# Patient Record
Sex: Male | Born: 1980 | Race: White | Hispanic: No | Marital: Single | Smoking: Current every day smoker
Health system: Southern US, Community
[De-identification: ages and names within clinical notes are randomized; demographics above are authoritative.]

## PROBLEM LIST (undated history)

## (undated) DIAGNOSIS — I509 Heart failure, unspecified: Secondary | ICD-10-CM

## (undated) HISTORY — PX: OTHER SURGICAL HISTORY: SHX169

---
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
## 2003-09-19 ENCOUNTER — Emergency Department (HOSPITAL_COMMUNITY): Admission: EM | Admit: 2003-09-19 | Discharge: 2003-09-19 | Payer: Self-pay | Admitting: Emergency Medicine

## 2004-06-20 ENCOUNTER — Ambulatory Visit (HOSPITAL_COMMUNITY): Admission: RE | Admit: 2004-06-20 | Discharge: 2004-06-20 | Payer: Self-pay | Admitting: Family Medicine

## 2004-06-25 ENCOUNTER — Ambulatory Visit: Payer: Self-pay | Admitting: Orthopedic Surgery

## 2004-07-30 ENCOUNTER — Ambulatory Visit: Payer: Self-pay | Admitting: Orthopedic Surgery

## 2004-12-06 ENCOUNTER — Ambulatory Visit (HOSPITAL_COMMUNITY): Admission: RE | Admit: 2004-12-06 | Discharge: 2004-12-06 | Payer: Self-pay | Admitting: Family Medicine

## 2005-02-25 ENCOUNTER — Emergency Department (HOSPITAL_COMMUNITY): Admission: EM | Admit: 2005-02-25 | Discharge: 2005-02-25 | Payer: Self-pay | Admitting: Emergency Medicine

## 2006-02-12 ENCOUNTER — Emergency Department (HOSPITAL_COMMUNITY): Admission: EM | Admit: 2006-02-12 | Discharge: 2006-02-12 | Payer: Self-pay | Admitting: Emergency Medicine

## 2006-07-21 ENCOUNTER — Emergency Department (HOSPITAL_COMMUNITY): Admission: EM | Admit: 2006-07-21 | Discharge: 2006-07-22 | Payer: Self-pay | Admitting: Emergency Medicine

## 2007-09-14 ENCOUNTER — Emergency Department (HOSPITAL_COMMUNITY): Admission: EM | Admit: 2007-09-14 | Discharge: 2007-09-14 | Payer: Self-pay | Admitting: Emergency Medicine

## 2009-11-22 ENCOUNTER — Emergency Department (HOSPITAL_COMMUNITY): Admission: EM | Admit: 2009-11-22 | Discharge: 2009-11-22 | Payer: Self-pay | Admitting: Emergency Medicine

## 2009-11-23 ENCOUNTER — Ambulatory Visit (HOSPITAL_COMMUNITY): Admission: RE | Admit: 2009-11-23 | Discharge: 2009-11-23 | Payer: Self-pay | Admitting: Emergency Medicine

## 2009-12-18 ENCOUNTER — Ambulatory Visit (HOSPITAL_COMMUNITY)
Admission: RE | Admit: 2009-12-18 | Discharge: 2009-12-18 | Payer: Self-pay | Source: Home / Self Care | Attending: Urology | Admitting: Urology

## 2010-03-19 LAB — BASIC METABOLIC PANEL
BUN: 10 mg/dL (ref 6–23)
CO2: 27 mEq/L (ref 19–32)
Calcium: 9.5 mg/dL (ref 8.4–10.5)
Chloride: 102 mEq/L (ref 96–112)
Creatinine, Ser: 1.05 mg/dL (ref 0.4–1.5)
GFR calc Af Amer: >60 mL/min (ref >60)
GFR calc non Af Amer: >60 mL/min (ref >60)
Glucose, Bld: 120 mg/dL — ABNORMAL HIGH (ref 70–99)
Potassium: 3.9 mEq/L (ref 3.5–5.1)
Sodium: 138 mEq/L (ref 135–145)

## 2010-03-19 LAB — CBC
HCT: 45.2 % (ref 39.0–52.0)
Hemoglobin: 16.8 g/dL (ref 13.0–17.0)
MCH: 37.8 pg — ABNORMAL HIGH (ref 26.0–34.0)
MCHC: 37.2 g/dL — ABNORMAL HIGH (ref 30.0–36.0)
MCV: 101.6 fL — ABNORMAL HIGH (ref 78.0–100.0)
Platelets: 106 10*3/uL — ABNORMAL LOW (ref 150–400)
RBC: 4.45 MIL/uL (ref 4.22–5.81)
RDW: 12.9 % (ref 11.5–15.5)
WBC: 5.5 10*3/uL (ref 4.0–10.5)

## 2010-03-19 LAB — SURGICAL PCR SCREEN
MRSA, PCR: NEGATIVE
Staphylococcus aureus: POSITIVE — AB

## 2010-12-18 ENCOUNTER — Emergency Department (HOSPITAL_COMMUNITY): Payer: Self-pay

## 2010-12-18 ENCOUNTER — Other Ambulatory Visit: Payer: Self-pay

## 2010-12-18 ENCOUNTER — Emergency Department (HOSPITAL_COMMUNITY)
Admission: EM | Admit: 2010-12-18 | Discharge: 2010-12-18 | Disposition: A | Discharge status: Other Acute Inpatient Hospital | Payer: Self-pay | Attending: Emergency Medicine | Admitting: Emergency Medicine

## 2010-12-18 DIAGNOSIS — I4510 Unspecified right bundle-branch block: Secondary | ICD-10-CM | POA: Insufficient documentation

## 2010-12-18 DIAGNOSIS — I509 Heart failure, unspecified: Secondary | ICD-10-CM | POA: Insufficient documentation

## 2010-12-18 DIAGNOSIS — I4892 Unspecified atrial flutter: Secondary | ICD-10-CM | POA: Insufficient documentation

## 2010-12-18 DIAGNOSIS — R609 Edema, unspecified: Secondary | ICD-10-CM | POA: Insufficient documentation

## 2010-12-18 DIAGNOSIS — M7989 Other specified soft tissue disorders: Secondary | ICD-10-CM | POA: Insufficient documentation

## 2010-12-18 DIAGNOSIS — Q249 Congenital malformation of heart, unspecified: Secondary | ICD-10-CM | POA: Insufficient documentation

## 2010-12-18 DIAGNOSIS — IMO0002 Reserved for concepts with insufficient information to code with codable children: Secondary | ICD-10-CM | POA: Insufficient documentation

## 2010-12-18 DIAGNOSIS — F17200 Nicotine dependence, unspecified, uncomplicated: Secondary | ICD-10-CM | POA: Insufficient documentation

## 2010-12-18 DIAGNOSIS — R0602 Shortness of breath: Secondary | ICD-10-CM | POA: Insufficient documentation

## 2010-12-18 LAB — URINALYSIS, ROUTINE W REFLEX MICROSCOPIC
Bilirubin Urine: NEGATIVE
Glucose, UA: NEGATIVE mg/dL
Hgb urine dipstick: NEGATIVE
Ketones, ur: NEGATIVE mg/dL
Leukocytes, UA: NEGATIVE
Nitrite: NEGATIVE
Protein, ur: NEGATIVE mg/dL
Specific Gravity, Urine: 1.025 (ref 1.005–1.030)
Urobilinogen, UA: 0.2 mg/dL (ref 0.0–1.0)
pH: 6 (ref 5.0–8.0)

## 2010-12-18 LAB — CBC
HCT: 51.5 % (ref 39.0–52.0)
Hemoglobin: 18.2 g/dL — ABNORMAL HIGH (ref 13.0–17.0)
MCH: 38.2 pg — ABNORMAL HIGH (ref 26.0–34.0)
MCHC: 35.3 g/dL (ref 30.0–36.0)
MCV: 108 fL — ABNORMAL HIGH (ref 78.0–100.0)
Platelets: 120 10*3/uL — ABNORMAL LOW (ref 150–400)
RBC: 4.77 MIL/uL (ref 4.22–5.81)
RDW: 14.7 % (ref 11.5–15.5)
WBC: 5.9 10*3/uL (ref 4.0–10.5)

## 2010-12-18 LAB — COMPREHENSIVE METABOLIC PANEL
AST: 26 U/L (ref 0–37)
Albumin: 2.5 g/dL — ABNORMAL LOW (ref 3.5–5.2)
Alkaline Phosphatase: 43 U/L (ref 39–117)
BUN: 10 mg/dL (ref 6–23)
CO2: 26 mEq/L (ref 19–32)
Calcium: 8.7 mg/dL (ref 8.4–10.5)
GFR calc Af Amer: >90 mL/min (ref >90)
Glucose, Bld: 112 mg/dL — ABNORMAL HIGH (ref 70–99)
Potassium: 4.1 mEq/L (ref 3.5–5.1)
Sodium: 134 mEq/L — ABNORMAL LOW (ref 135–145)
Total Bilirubin: 0.6 mg/dL (ref 0.3–1.2)
Total Protein: 4.8 g/dL — ABNORMAL LOW (ref 6.0–8.3)

## 2010-12-18 LAB — RAPID URINE DRUG SCREEN, HOSP PERFORMED
Barbiturates: NOT DETECTED
Cocaine: NOT DETECTED
Opiates: NOT DETECTED
Tetrahydrocannabinol: POSITIVE — AB

## 2010-12-18 LAB — APTT: aPTT: 26 seconds (ref 24–37)

## 2010-12-18 LAB — DIFFERENTIAL
Basophils Absolute: 0 10*3/uL (ref 0.0–0.1)
Basophils Relative: 1 % (ref 0–1)
Eosinophils Absolute: 0 10*3/uL (ref 0.0–0.7)
Eosinophils Relative: 1 % (ref 0–5)
Lymphs Abs: 0.6 10*3/uL — ABNORMAL LOW (ref 0.7–4.0)
Monocytes Absolute: 0.6 10*3/uL (ref 0.1–1.0)
Monocytes Relative: 10 % (ref 3–12)
Neutro Abs: 4.6 10*3/uL (ref 1.7–7.7)

## 2010-12-18 LAB — PROTIME-INR
INR: 1.07 (ref 0.00–1.49)
Prothrombin Time: 14.1 seconds (ref 11.6–15.2)

## 2010-12-18 LAB — TROPONIN I: Troponin I: <0.3 ng/mL (ref <0.30)

## 2010-12-18 MED ORDER — HEPARIN SOD (PORCINE) IN D5W 100 UNIT/ML IV SOLN
INTRAVENOUS | Status: AC
  Filled 2010-12-18: qty 250

## 2010-12-18 MED ORDER — FUROSEMIDE 10 MG/ML IJ SOLN
60.0000 mg | Freq: Once | INTRAMUSCULAR | Status: AC
  Administered 2010-12-18: 60 mg via INTRAVENOUS
  Filled 2010-12-18: qty 6

## 2010-12-18 MED ORDER — HEPARIN (PORCINE) IN NACL 100-0.45 UNIT/ML-% IJ SOLN
1000.0000 [IU]/h | INTRAMUSCULAR | Status: DC
  Administered 2010-12-18: 1000 [IU]/h via INTRAVENOUS
  Filled 2010-12-18 (×2): qty 250

## 2010-12-18 MED ORDER — DILTIAZEM HCL 50 MG/10ML IV SOLN
15.0000 mg | Freq: Once | INTRAVENOUS | Status: AC
  Administered 2010-12-18: 15 mg via INTRAVENOUS
  Filled 2010-12-18: qty 10

## 2010-12-18 MED ORDER — IOHEXOL 350 MG/ML SOLN
80.0000 mL | Freq: Once | INTRAVENOUS | Status: AC | PRN
  Administered 2010-12-18: 80 mL via INTRAVENOUS

## 2010-12-18 MED ORDER — HEPARIN BOLUS VIA INFUSION
4000.0000 [IU] | Freq: Once | INTRAVENOUS | Status: AC
  Administered 2010-12-18: 4000 [IU] via INTRAVENOUS
  Filled 2010-12-18: qty 4000

## 2010-12-18 MED ORDER — DILTIAZEM HCL 100 MG IV SOLR
5.0000 mg/h | INTRAVENOUS | Status: DC
  Administered 2010-12-18: 5 mg/h via INTRAVENOUS
  Administered 2010-12-18: 15 mg/h via INTRAVENOUS
  Filled 2010-12-18: qty 100

## 2010-12-18 NOTE — ED Notes (Signed)
Pt presents with bilateral leg swelling x 3 days.

## 2010-12-18 NOTE — ED Provider Notes (Addendum)
History     CSN: [id number] Arrival date & time: 12/18/2010  1:13 PM   First MD Initiated Contact with Patient 12/18/10 1323      Chief Complaint  Patient presents with  . Leg Swelling    (Consider location/radiation/quality/duration/timing/severity/associated sxs/prior treatment) HPI Patient relates he had some type of congenital heart disease and when he was 30 years old he had a Fontan procedure done at [facility]. He states he does not take SBE prophylaxis. Patient states about a week ago he did not feel well he states he felt extremely sick he said his head felt "horrible". He denies having a headache, he states is mainly fatigue. He states he then felt better and work for 3 days however 3 days ago he started getting swelling of his lower legs. He denies pain in his legs. He also states he feels that the swelling is up in his abdomen and his back. He also started having shortness of breath without chest pain. He's had a dry cough without fever. He denies nausea vomiting or diarrhea. He states he has dyspnea on exertion. He denies any change in activity or injury.  Primary care physician none   History reviewed. No pertinent past medical history. Congenital heart disease Degenerative disc disease of the back  Past Surgical History  Procedure Date  . Fontain procedure    hydrocele of the right testicle repaired recently  No family history on file. Both his maternal and paternal grandfather had CABG, cancer prominent in both sides of the family  History  Substance Use Topics  . Smoking status: Current Everyday Smoker -- 1.2 packs/day    Types: Cigarettes  . Smokeless tobacco: Not on file  . Alcohol Use: Yes     occ   employed    Review of Systems  All other systems reviewed and are negative.    Allergies  Review of patient's allergies indicates no known allergies.  Home Medications   Current Outpatient Rx  Name Route Sig Dispense Refill  .  DIPHENHYDRAMINE HCL 25 MG PO TABS Oral Take 25 mg by mouth every 6 (six) hours as needed. Swelling     . FISH OIL 1000 MG PO CAPS Oral Take 1 capsule by mouth 3 (three) times daily.      . OXYCODONE-ACETAMINOPHEN 5-325 MG PO TABS Oral Take 1 tablet by mouth every 4 (four) hours as needed. Pain       BP 127/96  Pulse 47  Temp(Src) 98.4 F (36.9 C) (Oral)  Resp 9  Ht 5\' 10"  (1.778 m)  Wt 150 lb (68.04 kg)  BMI 21.52 kg/m2  SpO2 93%  Vital signs show bradycardia (this must be a radial pulse because his rate on monitor is 135), otherwise normal  Physical Exam  Nursing note and vitals reviewed. Constitutional: He is oriented to person, place, and time. He appears well-developed and well-nourished.  Non-toxic appearance. He does not appear ill. No distress.  HENT:  Head: Normocephalic and atraumatic.  Right Ear: External ear normal.  Left Ear: External ear normal.  Nose: Nose normal. No mucosal edema or rhinorrhea.  Mouth/Throat: Oropharynx is clear and moist and mucous membranes are normal. No dental abscesses or uvula swelling.  Eyes: Conjunctivae and EOM are normal. Pupils are equal, round, and reactive to light.  Neck: Normal range of motion and full passive range of motion without pain. Neck supple.  Cardiovascular: Normal rate, regular rhythm and normal heart sounds.  Exam reveals no gallop and no  friction rub.   No murmur heard. Pulmonary/Chest: No respiratory distress. He has wheezes. He has no rhonchi. He has no rales. He exhibits no tenderness and no crepitus.       Patient appears anxious, he is noted to have some late and expiratory scattered wheezing.  Abdominal: Soft. Normal appearance and bowel sounds are normal. He exhibits no distension. There is no tenderness. There is no rebound and no guarding.  Musculoskeletal: Normal range of motion. He exhibits edema. He exhibits no tenderness.       Moves all extremities well. Patient's noted to have pitting edema around his ankles  and feet with trace pitting edema of his lower legs. He said to have scattered small punctate red lesions on his skin he states are not prutici  Neurological: He is alert and oriented to person, place, and time. He has normal strength. No cranial nerve deficit.  Skin: Skin is warm, dry and intact. No rash noted. No erythema. No pallor.  Psychiatric: His speech is normal and behavior is normal. His mood appears not anxious.       Patient anxious, he told the nurse he passes out when he gets his blood drawn    ED Course  Procedures (including critical care time)  Patient was started on Cardizem bolus and drip. His heart rate stabilized to around 100. He was noted to have atrial flutter with variable block. He was given Lasix IV for his congestive heart failure and was having good urine output at the time of transfer. She is started on a heparin bolus and heparin drip prior to transfer.   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 It is useful he done with patient to have hypoplastic right or left heart syndrome or disease (hypoplastic left or right ventricle), or tricuspid atresia with hypoplastic right ventricle. It states the blood flows from the IVC and bypasses the right ventricle and go strictly to the pulmonary artery passively. It also states patient's need SBE prophylaxis which patient states he does not do. It also states a common complication his atrial flutter due to scar tissue in the right atrium.  1510 I spoke with Dr. Tyron Russell, radiology trying to decide how to do a spiral CT angiography chest. He is going to do more research to try to figure out timing of the dye  1519 Dr. Juanetta Gosling office states it's been over 15 years since he was there. She states only diagnosis she found with cardiac arrhythmia and Dr. Juanetta Gosling had done EKG on him.  1524 Dr. Tyron Russell states her 3 variations of the Fontan procedure, he recommends doing noncontrast CT to CT computerized the anatomy before we  do contrast. He states one variation the blood supply to the right lung and left lung are from different sources so injecting him for a spiral CT would only study 1 lung.  17:54 Dr Lyndel Safe, [facility] accepts in transfer, waiting to get bed assignment.  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, he also had some heart valves that were not performed and he had surgery to develop his valves and then he had the Fortan  procedure done   Results for orders placed during the hospital encounter of 12/18/10  CBC      Component Value Range   WBC 5.9  4.0 - 10.5 (K/uL)   RBC 4.77  4.22 - 5.81 (MIL/uL)   Hemoglobin 18.2 (*) 13.0 -  17.0 (g/dL)   HCT 16.1  09.6 - 04.5 (%)   MCV 108.0 (*) 78.0 - 100.0 (fL)   MCH 38.2 (*) 26.0 - 34.0 (pg)   MCHC 35.3  30.0 - 36.0 (g/dL)   RDW 40.9  81.1 - 91.4 (%)   Platelets 120 (*) 150 - 400 (K/uL)  DIFFERENTIAL      Component Value Range   Neutrophils Relative 78 (*) 43 - 77 (%)   Neutro Abs 4.6  1.7 - 7.7 (K/uL)   Lymphocytes Relative 11 (*) 12 - 46 (%)   Lymphs Abs 0.6 (*) 0.7 - 4.0 (K/uL)   Monocytes Relative 10  3 - 12 (%)   Monocytes Absolute 0.6  0.1 - 1.0 (K/uL)   Eosinophils Relative 1  0 - 5 (%)   Eosinophils Absolute 0.0  0.0 - 0.7 (K/uL)   Basophils Relative 1  0 - 1 (%)   Basophils Absolute 0.0  0.0 - 0.1 (K/uL)  COMPREHENSIVE METABOLIC PANEL      Component Value Range   Sodium 134 (*) 135 - 145 (mEq/L)   Potassium 4.1  3.5 - 5.1 (mEq/L)   Chloride 100  96 - 112 (mEq/L)   CO2 26  19 - 32 (mEq/L)   Glucose, Bld 112 (*) 70 - 99 (mg/dL)   BUN 10  6 - 23 (mg/dL)   Creatinine, Ser 7.82  0.50 - 1.35 (mg/dL)   Calcium 8.7  8.4 - 95.6 (mg/dL)   Total Protein 4.8 (*) 6.0 - 8.3 (g/dL)   Albumin 2.5 (*) 3.5 - 5.2 (g/dL)   AST 26  0 - 37 (U/L)   ALT 16  0 - 53 (U/L)   Alkaline Phosphatase 43  39 - 117 (U/L)   Total Bilirubin 0.6  0.3 - 1.2 (mg/dL)   GFR calc non Af Amer  >90  >90 (mL/min)   GFR calc Af Amer >90  >90 (mL/min)  TROPONIN I      Component Value Range   Troponin I <0.30  <0.30 (ng/mL)  APTT      Component Value Range   aPTT 26  24 - 37 (seconds)  PROTIME-INR      Component Value Range   Prothrombin Time 14.1  11.6 - 15.2 (seconds)   INR 1.07  0.00 - 1.49   D-DIMER, QUANTITATIVE      Component Value Range   D-Dimer, Quant 1.50 (*) 0.00 - 0.48 (ug/mL-FEU)  PRO B NATRIURETIC PEPTIDE      Component Value Range   Pro B Natriuretic peptide (BNP) 1228.0 (*) 0 - 125 (pg/mL)  POCT I-STAT TROPONIN I      Component Value Range   Troponin i, poc 0.00  0.00 - 0.08 (ng/mL)   Comment 3             Laboratory interpretation elevated d-dimer, elevated BNP, very elevated MCV, concentrated hemoglobin, otherwise normal  Ct Angio Chest W/cm &/or Wo Cm  12/18/2010  *RADIOLOGY REPORT*  Clinical Data:  Leg swelling, elevated D-dimer, question pulmonary embolism, history congenital heart disease post Fontan procedure 08/06  CT ANGIOGRAPHY CHEST WITH CONTRAST  Technique:  Multidetector CT imaging of the chest was performed using the standard protocol during bolus administration of intravenous contrast.  Multiplanar CT image reconstructions including MIPs were obtained to evaluate the vascular anatomy. Initial noncontrast images were obtained to delineate the thoracic vascular anatomy.  Contrast: 80mL OMNIPAQUE IOHEXOL 350 MG/ML IV SOLN  Comparison:  None  Findings: Aorta normal caliber.  Absent right ventricle and tricuspid valve. Post Fontan procedure with patent anastomosis of right atrium to main pulmonary artery. Upper lobe pulmonary arteries are suboptimally opacified, grossly patent. Remaining pulmonary arteries appear patent. No definite evidence of pulmonary embolism identified. Numerous normal and upper normal-sized thoracic lymph nodes. A few tiny nonspecific right lung nodules are identified, with minimal atelectasis at medial aspect of right middle lobe. No  pulmonary infiltrate or pneumothorax. Prior median sternotomy but no acute osseous findings.  Abnormal appearance of visualized portion of upper abdomen. Liver appears nodular and cirrhotic. Spleen appears mildly enlarged though incompletely visualized, measures 11.9 x 9.0 cm axial dimensions image 56. Small amount of perihepatic ascites. Tiny left pleural effusion and minimal pericardial effusion. Scattered subcutaneous soft tissue edema. Mild gynecomastia bilaterally.  Review of the MIP images confirms the above findings.  IMPRESSION: No definite evidence of pulmonary embolism, though upper lobe pulmonary arteries are somewhat less well opacified and assessed. Post Fontan procedure with patent right atrial to main pulmonary artery anastomosis. Cirrhotic appearing liver with minimal ascites, scattered subcutaneous edema, and question minimal splenic enlargement, the spleen and liver are both incompletely visualized on CT chest.  Original Report Authenticated By: Lollie Marrow, M.D.   US Venous Img Lower Bilateral  12/18/2010  *RADIOLOGY REPORT*  Clinical Data: Bilateral lower extremity swelling, elevated D- dimer, question deep venous thrombosis  VENOUS DUPLEX ULTRASOUND OF BILATERAL LOWER EXTREMITIES  Technique:  Gray-scale sonography with graded compression, as well as color Doppler and duplex ultrasound, were performed to evaluate the deep venous system of both lower extremities from the level of the common femoral vein through the popliteal and proximal calf veins.  Spectral Doppler was utilized to evaluate flow at rest and with distal augmentation maneuvers.  Comparison:  None  Findings: Deep venous systems appear patent and compressible from groin to popliteal fossa bilaterally.  Spontaneous venous flow present with intact augmentation and evidence of respiratory phasicity.  No intraluminal thrombus identified.  Scattered subcutaneous edema identified at the calves.  Visualized portions of the greater  saphenous systems appear patent.  IMPRESSION: No evidence of deep venous thrombosis in the lower extremities.  Original Report Authenticated By: Lollie Marrow, M.D.   Dg Chest Portable 1 View  12/18/2010  *RADIOLOGY REPORT*  Clinical Data: Shortness of breath.  Swelling in the legs and face 3 days.  History of smoking.  Increased heart rate.  PORTABLE CHEST - 1 VIEW  Comparison: None.  Findings: Heart is enlarged.  There is perihilar bronchitic change. Right paratracheal density raises the question of adenopathy. Further evaluation with chest CT with contrast is recommended. There are no focal consolidations or pleural effusions.  IMPRESSION:  1.  Cardiomegaly. 2.  Bronchitic changes. 3.  Question of mediastinal adenopathy.  Further evaluation with chest CT with contrast is suggested.  Original Report Authenticated By: Patterson Hammersmith, M.D.       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  Critical care was necessary to treat or prevent imminent or life-threatening deterioration.  Critical care was time spent personally by me on the following activities: development of treatment plan with patient and/or surrogate as well as nursing, discussions with consultants, evaluation of patient's response to treatment, examination of patient, obtaining history from  patient or surrogate, ordering and performing treatments and interventions, ordering and review of laboratory studies, ordering and review of radiographic studies, pulse oximetry and re-evaluation of patient's condition.      MDM          Ward Givens, MD 12/18/10 2010  Ward Givens, MD 12/18/10 2011

## 2010-12-18 NOTE — ED Notes (Signed)
Pt with heart rate 130s and reports SOB; bilateral 2+pitting edema bilateral lower extremities to above ankles. Denies pain; A&Ox4; answers questions appropriately.

## 2010-12-18 NOTE — ED Notes (Signed)
Moved to ED 19-placed on cardiac monitor and continuous pulse ox-heart rate 136; RA SpO2 88%; placed on O2 2L/min per [place]--SpO2 increased to 93%; EKG obtained.

## 2010-12-18 NOTE — ED Notes (Signed)
Pt resting.  HR remains slightly tachycardic.  Denies any pain at this time. No distress or SOB noted.  Lasix and cardizem given. Cardizem gtt continues at 15 mg/hr.

## 2013-03-02 ENCOUNTER — Ambulatory Visit: Payer: Medicaid Other | Admitting: Podiatry

## 2013-03-30 ENCOUNTER — Ambulatory Visit: Payer: Medicaid Other | Admitting: Podiatry

## 2014-06-24 ENCOUNTER — Emergency Department (HOSPITAL_COMMUNITY): Payer: Medicare Other

## 2014-06-24 ENCOUNTER — Emergency Department (HOSPITAL_COMMUNITY)
Admission: EM | Admit: 2014-06-24 | Discharge: 2014-06-25 | Disposition: A | Discharge status: Other Acute Inpatient Hospital | Payer: Medicare Other | Attending: Emergency Medicine | Admitting: Emergency Medicine

## 2014-06-24 ENCOUNTER — Encounter (HOSPITAL_COMMUNITY): Payer: Self-pay | Admitting: Emergency Medicine

## 2014-06-24 DIAGNOSIS — F1110 Opioid abuse, uncomplicated: Secondary | ICD-10-CM | POA: Diagnosis not present

## 2014-06-24 DIAGNOSIS — I469 Cardiac arrest, cause unspecified: Secondary | ICD-10-CM | POA: Diagnosis present

## 2014-06-24 DIAGNOSIS — R4020 Unspecified coma: Secondary | ICD-10-CM | POA: Insufficient documentation

## 2014-06-24 DIAGNOSIS — E876 Hypokalemia: Secondary | ICD-10-CM | POA: Diagnosis not present

## 2014-06-24 DIAGNOSIS — Z720 Tobacco use: Secondary | ICD-10-CM | POA: Diagnosis not present

## 2014-06-24 DIAGNOSIS — J9601 Acute respiratory failure with hypoxia: Secondary | ICD-10-CM | POA: Diagnosis not present

## 2014-06-24 DIAGNOSIS — Z79899 Other long term (current) drug therapy: Secondary | ICD-10-CM | POA: Insufficient documentation

## 2014-06-24 DIAGNOSIS — F1310 Sedative, hypnotic or anxiolytic abuse, uncomplicated: Secondary | ICD-10-CM | POA: Insufficient documentation

## 2014-06-24 DIAGNOSIS — N179 Acute kidney failure, unspecified: Secondary | ICD-10-CM

## 2014-06-24 DIAGNOSIS — E1311 Other specified diabetes mellitus with ketoacidosis with coma: Secondary | ICD-10-CM

## 2014-06-24 HISTORY — DX: Heart failure, unspecified: I50.9

## 2014-06-24 LAB — CBG MONITORING, ED: GLUCOSE-CAPILLARY: 337 mg/dL — AB (ref 65–99)

## 2014-06-24 MED ORDER — MIDAZOLAM HCL 2 MG/2ML IJ SOLN
2.0000 mg | Freq: Once | INTRAMUSCULAR | Status: AC
  Administered 2014-06-24: 2 mg via INTRAVENOUS

## 2014-06-24 MED ORDER — SODIUM CHLORIDE 0.9 % IV SOLN
2000.0000 mL | Freq: Once | INTRAVENOUS | Status: AC
  Administered 2014-06-25: 2000 mL via INTRAVENOUS

## 2014-06-24 MED ORDER — PROPOFOL 10 MG/ML IV BOLUS
INTRAVENOUS | Status: AC
  Filled 2014-06-24: qty 20

## 2014-06-24 MED ORDER — LIDOCAINE HCL (CARDIAC) 20 MG/ML IV SOLN
INTRAVENOUS | Status: AC
  Filled 2014-06-24: qty 5

## 2014-06-24 MED ORDER — ROCURONIUM BROMIDE 50 MG/5ML IV SOLN
INTRAVENOUS | Status: AC
  Filled 2014-06-24: qty 2

## 2014-06-24 MED ORDER — PROPOFOL 1000 MG/100ML IV EMUL
INTRAVENOUS | Status: AC
  Filled 2014-06-24: qty 100

## 2014-06-24 MED ORDER — ETOMIDATE 2 MG/ML IV SOLN
INTRAVENOUS | Status: AC
  Administered 2014-06-24: 20 mg
  Filled 2014-06-24: qty 20

## 2014-06-24 MED ORDER — MIDAZOLAM HCL 2 MG/2ML IJ SOLN
INTRAMUSCULAR | Status: AC
  Administered 2014-06-24: 2 mg via INTRAVENOUS
  Filled 2014-06-24: qty 2

## 2014-06-24 MED ORDER — PROPOFOL 1000 MG/100ML IV EMUL
5.0000 ug/kg/min | INTRAVENOUS | Status: DC
  Administered 2014-06-24: 5 ug/kg/min via INTRAVENOUS

## 2014-06-24 MED ORDER — FENTANYL CITRATE (PF) 100 MCG/2ML IJ SOLN
100.0000 ug | Freq: Once | INTRAMUSCULAR | Status: AC
  Administered 2014-06-24: 100 ug via INTRAVENOUS

## 2014-06-24 MED ORDER — SUCCINYLCHOLINE CHLORIDE 20 MG/ML IJ SOLN
INTRAMUSCULAR | Status: AC
  Administered 2014-06-24: 100 mg
  Filled 2014-06-24: qty 1

## 2014-06-24 MED ORDER — FENTANYL CITRATE (PF) 100 MCG/2ML IJ SOLN
INTRAMUSCULAR | Status: AC
  Administered 2014-06-24: 100 ug via INTRAVENOUS
  Filled 2014-06-24: qty 2

## 2014-06-24 NOTE — ED Notes (Addendum)
Patient found unresponsive by family. No pulse, no breathing. Per EMS, patient given epi x 2, D50, and narcan. Shocked x 2. Patient presents to ED intubated, with pulse of 91. CBG 337. States patient is on heart transplant list.

## 2014-06-24 NOTE — ED Notes (Signed)
[facility] @ [phone]. Dr Bebe Shaggy on phone at this time.

## 2014-06-24 NOTE — ED Provider Notes (Signed)
CSN: [id number]     Arrival date & time 06/24/14  2303 History   First MD Initiated Contact with Patient 06/24/14 2306     Chief Complaint  Patient presents with  . Cardiac Arrest   LEVEL 5 CAVEAT PATIENT IS UNRESPONSIVE  Patient is a 34 y.o. male presenting with altered mental status. The history is provided by the EMS personnel and a relative.  Altered Mental Status Severity:  Severe Most recent episode:  Today Timing:  Constant Chronicity:  New Patient presents from home Father reports pt has been "sick all week" with vomiting Today while he was at home he became unresponsive.  Father reports he appeared to have seizure and was "Gasping for breath".  He was placed on floor and no trauma reported EMS called and pt was in cardiac arrest [facility] airway placed.  He was given epinephrine/narcan/D50.  He was noted to be in v-fib at one time was defibrillated.  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 CHF (congestive heart failure)    Past Surgical History  Procedure Laterality Date  . Fontain procedure     History reviewed. No pertinent family history. History  Substance Use Topics  . Smoking status: Current Every Day Smoker -- 1.25 packs/day    Types: Cigarettes  . Smokeless tobacco: Not on file  . Alcohol Use: Yes     Comment: occ    Review of Systems  Unable to perform ROS: Patient unresponsive      Allergies  Review of patient's allergies indicates no known allergies.  Home Medications   Prior to Admission medications   Medication Sig Start Date End Date Taking? Authorizing Provider  diphenhydrAMINE (BENADRYL) 25 MG tablet Take 25 mg by mouth every 6 (six) hours as needed. Swelling     Historical Provider, MD  Omega-3 Fatty Acids (FISH OIL) 1000 MG CAPS Take 1 capsule by mouth 3 (three) times daily.      Historical Provider, MD   oxyCODONE-acetaminophen (PERCOCET) 5-325 MG per tablet Take 1 tablet by mouth every 4 (four) hours as needed. Pain     Historical Provider, MD   BP 104/82 mmHg  Pulse 41  Temp(Src) 96.1 F (35.6 C) (Other (Comment))  Resp 24  Wt 150 lb (68.04 kg)  SpO2 100% Physical Exam CONSTITUTIONAL: unresponsive HEAD: Normocephalic/atraumatic EYES: pupils fixed/dilated ENMT: Mucous membranes moist, king airway in place, vomit in mouth on arrival NECK: supple no meningeal signs SPINE/BACK:No bruising/crepitance/stepoffs noted to spine CV: loud murmur noted LUNGS: decreased BS noted bilaterally.  ABDOMEN: soft WU:[id number] NEURO: Pt is unresponsive.  He has some response to pain with IV placement.    EXTREMITIES: pulses normal/equal SKIN: cold to touch PSYCH:unable to assess  ED Course  Procedures  CRITICAL CARE Performed by: Joya Gaskins Total critical care time: 17 Critical care time was exclusive of separately billable procedures and treating other patients. Critical care was necessary to treat or prevent imminent or life-threatening deterioration. Critical care was time spent personally by me on the following activities: development of treatment plan with patient and/or surrogate as well as nursing, discussions with consultants, evaluation of patient's response to treatment, examination of patient, obtaining history from patient or surrogate, ordering and performing treatments and interventions, ordering and review of laboratory studies, ordering and review of radiographic studies, pulse oximetry and re-evaluation of patient's condition. MECHANICAL VENTILATION HAS BEEN MANAGED HE IS PLACED ON  IV POTASSIUM AND IV INSULIN CONSULTS HAVE BEEN CALLED PT HAS BEEN STABILIZED IN THE ED MULTIPLE DISCUSSIONS WITH FAMILY   INTUBATION Performed by: Joya Gaskins  Required items: required devices, and special equipment available Patient identity confirmed: provided demographic data and  hospital-assigned identification number Timeout not called due to emergent situation King airway removed prior to intubation Copious secretions/vomit in mouth on examination Indications: unresponsive  Intubation method: Glidescope Laryngoscopy   Preoxygenation: BVM  Sedatives: Etomidate Paralytic: Succinylcholine  Tube Size: 7.5 cuffed  Post-procedure assessment: chest rise and ETCO2 monitor Breath sounds: equal and absent over the epigastrium Tube secured with: ETT holder Chest x-ray interpreted by me.  Chest x-ray findings: endotracheal tube in appropriate position  Patient tolerated the procedure well with no immediate complications.    Labs Review Labs Reviewed  CBC - Abnormal; Notable for the following:    WBC 17.1 (*)    RBC 4.17 (*)    MCV 102.4 (*)    MCH 35.7 (*)    All other components within normal limits  CBG MONITORING, ED - Abnormal; Notable for the following:    Glucose-Capillary 337 (*)    All other components within normal limits  DIGOXIN LEVEL  BLOOD GAS, ARTERIAL  APTT  BASIC METABOLIC PANEL  PROTIME-INR  TROPONIN I  URINALYSIS, ROUTINE W REFLEX MICROSCOPIC (NOT AT [facility])  URINE RAPID DRUG SCREEN, HOSP PERFORMED  CBG MONITORING, ED  I-STAT CG4 LACTIC ACID, ED  I-STAT CHEM 8, ED    Imaging Review Ct Head Wo Contrast  13-Jul-2014   CLINICAL DATA:  Patient was found unresponsive. Apparently hit the floor from a standing position. Intubated.  EXAM: CT HEAD WITHOUT CONTRAST  TECHNIQUE: Contiguous axial images were obtained from the base of the skull through the vertex without intravenous contrast.  COMPARISON:  09/19/2003  FINDINGS: Technically limited study due to motion artifact despite repeat imaging. As visualized, the ventricles and sulci appear symmetrical. No mass effect or midline shift. No abnormal extra-axial fluid collections. Gray-white matter junctions are distinct. Basal cisterns are not effaced. No evidence of acute intracranial  hemorrhage. No depressed skull fractures. Mucosal thickening in the maxillary antra. Mastoid air cells are not opacified.  IMPRESSION: Motion artifact limits the study. No acute intracranial abnormality is identified.   Electronically Signed   By: Burman Nieves M.D.   On: 07/13/2014 00:23   Dg Chest Port 1 View  06/24/2014   CLINICAL DATA:  Patient was found unresponsive. Shocked x2. OG tube placement. History of CHF on transplant list.  EXAM: PORTABLE CHEST - 1 VIEW  COMPARISON:  12/18/2010  FINDINGS: Postoperative changes in the mediastinum. Endotracheal tube with tip measuring 4.5 cm above the carina. Enteric tube tip is off the field of view but below the left hemidiaphragm. Shallow inspiration. Cardiac enlargement. No pulmonary vascular congestion. No airspace disease in the lungs. No blunting of costophrenic angles. No pneumothorax.  IMPRESSION: Appliances appear in satisfactory position. Cardiac enlargement without vascular congestion or consolidation.   Electronically Signed   By: Burman Nieves M.D.   On: 06/24/2014 23:46     EKG Interpretation   Date/Time:  Friday June 24 2014 23:10:04 EDT Ventricular Rate:  92 PR Interval:  252 QRS Duration: 161 QT Interval:  404 QTC Calculation: 500 R Axis:   113 Text Interpretation:  Sinus rhythm Prolonged PR interval Nonspecific  intraventricular conduction delay Abnormal ekg Confirmed by Bebe Shaggy  MD,  Lael Wetherbee ([id number]) on 06/24/2014 11:23:44 PM       EKG  Interpretation  Date/Time:  Saturday 2014/07/02 00:16:53 EDT Ventricular Rate:  88 PR Interval:  212 QRS Duration: 152 QT Interval:  411 QTC Calculation: 497 R Axis:   122 Text Interpretation:  Sinus rhythm Prolonged PR interval Nonspecific intraventricular conduction delay Lateral infarct, acute (LAD) No significant change since last tracing significant artifact noted Confirmed by Bebe Shaggy  MD, Dorinda Hill ([id number]) on 2014-07-02 12:29:55 AM        11:45 PM D/w family and updated on  plan (mother/father) Pt with complex congenital heart defect Pt stabilized Call placed to CCU at [facility] 11:50 PM D/w cardiology at [facility] Dr Lenis Noon She recommends starting dopamine if necessary for pressor support Will accept to CCU at [facility] 12:30 AM Vitals improved SBP>100 CT head negative Repeat EKG unchanged  Will be [facility] to [facility] in one hour Hypothermia protocol started for patient 1:23 AM Will attempt to maintain temp at 34C He is in DKA and also with hypokalemia - insulin drip ordered and also IV potassium He has elevated INR without evidence of bleeding - will hold coumadin for now SBP is >100, he does not require pressors at this time  Medications  lidocaine (cardiac) 100 mg/51ml (XYLOCAINE) 20 MG/ML injection 2% (not administered)  rocuronium (ZEMURON) 50 MG/5ML injection (not administered)  0.9 %  sodium chloride infusion (not administered)  fentaNYL (SUBLIMAZE) injection 100 mcg (not administered)  midazolam (VERSED) injection 2 mg (not administered)  fentaNYL (SUBLIMAZE) 100 MCG/2ML injection (not administered)  midazolam (VERSED) 2 MG/2ML injection (not administered)  propofol (DIPRIVAN) 1000 MG/100ML infusion (5 mcg/kg/min Intravenous New Bag/Given 06/24/14 2331)  succinylcholine (ANECTINE) 20 MG/ML injection (100 mg  Given 06/24/14 2316)  etomidate (AMIDATE) 2 MG/ML injection (20 mg  Given 06/24/14 2315)     MDM   Final diagnoses:  Acute respiratory failure with hypoxia  Cardiac arrest  Diabetic ketoacidosis with coma associated with other specified diabetes mellitus  Hypokalemia  AKI (acute kidney injury)    Nursing notes including past medical history and social history reviewed and considered in documentation Labs/vital reviewed myself and considered during evaluation xrays/imaging reviewed by myself and considered during evaluation Previous records reviewed and considered     Zadie Rhine, MD 07/02/14 0127

## 2014-06-25 DIAGNOSIS — F1310 Sedative, hypnotic or anxiolytic abuse, uncomplicated: Secondary | ICD-10-CM | POA: Diagnosis not present

## 2014-06-25 DIAGNOSIS — J9601 Acute respiratory failure with hypoxia: Secondary | ICD-10-CM | POA: Diagnosis not present

## 2014-06-25 DIAGNOSIS — E1311 Other specified diabetes mellitus with ketoacidosis with coma: Secondary | ICD-10-CM | POA: Diagnosis not present

## 2014-06-25 DIAGNOSIS — R4020 Unspecified coma: Secondary | ICD-10-CM | POA: Diagnosis not present

## 2014-06-25 DIAGNOSIS — I469 Cardiac arrest, cause unspecified: Secondary | ICD-10-CM | POA: Diagnosis present

## 2014-06-25 DIAGNOSIS — F1110 Opioid abuse, uncomplicated: Secondary | ICD-10-CM | POA: Diagnosis not present

## 2014-06-25 DIAGNOSIS — Z79899 Other long term (current) drug therapy: Secondary | ICD-10-CM | POA: Diagnosis not present

## 2014-06-25 DIAGNOSIS — Z720 Tobacco use: Secondary | ICD-10-CM | POA: Diagnosis not present

## 2014-06-25 DIAGNOSIS — N179 Acute kidney failure, unspecified: Secondary | ICD-10-CM | POA: Diagnosis not present

## 2014-06-25 DIAGNOSIS — E876 Hypokalemia: Secondary | ICD-10-CM | POA: Diagnosis not present

## 2014-06-25 LAB — URINALYSIS, ROUTINE W REFLEX MICROSCOPIC
Bilirubin Urine: NEGATIVE
GLUCOSE, UA: NEGATIVE mg/dL
Hgb urine dipstick: NEGATIVE
KETONES UR: NEGATIVE mg/dL
Leukocytes, UA: NEGATIVE
Nitrite: NEGATIVE
Protein, ur: NEGATIVE mg/dL
Specific Gravity, Urine: 1.015 (ref 1.005–1.030)
Urobilinogen, UA: 0.2 mg/dL (ref 0.0–1.0)
pH: 7.5 (ref 5.0–8.0)

## 2014-06-25 LAB — CBC
HEMATOCRIT: 42.7 % (ref 39.0–52.0)
Hemoglobin: 14.9 g/dL (ref 13.0–17.0)
MCH: 35.7 pg — ABNORMAL HIGH (ref 26.0–34.0)
MCHC: 34.9 g/dL (ref 30.0–36.0)
MCV: 102.4 fL — ABNORMAL HIGH (ref 78.0–100.0)
Platelets: 202 10*3/uL (ref 150–400)
RBC: 4.17 MIL/uL — ABNORMAL LOW (ref 4.22–5.81)
RDW: 14.2 % (ref 11.5–15.5)
WBC: 17.1 10*3/uL — ABNORMAL HIGH (ref 4.0–10.5)

## 2014-06-25 LAB — DIGOXIN LEVEL: DIGOXIN LVL: 0.9 ng/mL (ref 0.8–2.0)

## 2014-06-25 LAB — RAPID URINE DRUG SCREEN, HOSP PERFORMED
AMPHETAMINES: NOT DETECTED
BARBITURATES: NOT DETECTED
Benzodiazepines: POSITIVE — AB
Cocaine: NOT DETECTED
Opiates: POSITIVE — AB
Tetrahydrocannabinol: NOT DETECTED

## 2014-06-25 LAB — I-STAT CHEM 8, ED
BUN: 14 mg/dL (ref 6–20)
CALCIUM ION: 0.98 mmol/L — AB (ref 1.12–1.23)
CREATININE: 1.5 mg/dL — AB (ref 0.61–1.24)
Chloride: 86 mmol/L — ABNORMAL LOW (ref 101–111)
GLUCOSE: 503 mg/dL — AB (ref 65–99)
HCT: 50 % (ref 39.0–52.0)
Hemoglobin: 17 g/dL (ref 13.0–17.0)
POTASSIUM: 2.2 mmol/L — AB (ref 3.5–5.1)
SODIUM: 122 mmol/L — AB (ref 135–145)
TCO2: 17 mmol/L (ref 0–100)

## 2014-06-25 LAB — BASIC METABOLIC PANEL
ANION GAP: 24 — AB (ref 5–15)
BUN: 14 mg/dL (ref 6–20)
CHLORIDE: 81 mmol/L — AB (ref 101–111)
CO2: 17 mmol/L — ABNORMAL LOW (ref 22–32)
CREATININE: 1.73 mg/dL — AB (ref 0.61–1.24)
Calcium: 7.6 mg/dL — ABNORMAL LOW (ref 8.9–10.3)
GFR calc non Af Amer: 51 mL/min — ABNORMAL LOW (ref >60)
GFR, EST AFRICAN AMERICAN: 59 mL/min — AB (ref >60)
Glucose, Bld: 540 mg/dL — ABNORMAL HIGH (ref 65–99)
POTASSIUM: 2.3 mmol/L — AB (ref 3.5–5.1)
SODIUM: 122 mmol/L — AB (ref 135–145)

## 2014-06-25 LAB — PROTIME-INR
INR: 10.74 — AB (ref 0.00–1.49)
Prothrombin Time: 79.8 seconds — ABNORMAL HIGH (ref 11.6–15.2)

## 2014-06-25 LAB — CBG MONITORING, ED: GLUCOSE-CAPILLARY: 493 mg/dL — AB (ref 65–99)

## 2014-06-25 LAB — I-STAT CG4 LACTIC ACID, ED: LACTIC ACID, VENOUS: 11.82 mmol/L — AB (ref 0.5–2.0)

## 2014-06-25 LAB — TROPONIN I: Troponin I: 0.09 ng/mL — ABNORMAL HIGH (ref <0.031)

## 2014-06-25 LAB — APTT: aPTT: 75 seconds — ABNORMAL HIGH (ref 24–37)

## 2014-06-25 MED ORDER — POTASSIUM CHLORIDE 10 MEQ/100ML IV SOLN
10.0000 meq | Freq: Once | INTRAVENOUS | Status: AC
  Administered 2014-06-25: 10 meq via INTRAVENOUS
  Filled 2014-06-25: qty 100

## 2014-06-25 MED ORDER — SODIUM CHLORIDE 0.9 % IV SOLN
INTRAVENOUS | Status: DC
  Administered 2014-06-25: 4.3 [IU]/h via INTRAVENOUS
  Filled 2014-06-25: qty 2.5

## 2014-06-25 MED ORDER — VECURONIUM BROMIDE 10 MG IV SOLR
5.0000 mg | Freq: Once | INTRAVENOUS | Status: AC
  Administered 2014-06-25: 5 mg via INTRAVENOUS
  Filled 2014-06-25: qty 10

## 2014-06-25 MED ORDER — PROPOFOL 1000 MG/100ML IV EMUL: 5.0000 ug/kg/min | INTRAVENOUS | Status: DC

## 2014-06-25 NOTE — ED Notes (Signed)
Notified Dr.Wickline of the I-Stat Lactic Acid level.

## 2014-06-25 NOTE — ED Notes (Signed)
Bags of ice placed on the sides of pt's neck, axilla, and groin area.

## 2014-06-25 NOTE — ED Notes (Addendum)
Called and reported to [facility] new meds that were given and labs that had resulted.

## 2014-06-25 NOTE — ED Provider Notes (Signed)
The patient appears reasonably stabilized for transfer considering the current resources, flow, and capabilities available in the ED at this time, and I doubt any other [facility] requiring further screening and/or treatment in the ED prior to transfer.  BP 112/71 mmHg  Pulse 64  Temp(Src) 95.2 F (35.1 C) (Other (Comment))  Resp 26  Wt 150 lb (68.04 kg)  SpO2 100%   Zadie Rhine, MD 07-19-2014 [phone]

## 2014-06-27 LAB — CBG MONITORING, ED: Glucose-Capillary: 475 mg/dL — ABNORMAL HIGH (ref 65–99)

## 2014-07-08 DEATH — deceased

## 2015-07-05 IMAGING — CT CT HEAD W/O CM
1 of 2 series · 16 of 30 positions shown, 20 images · non-contrast
Comparison: 09/19/2003

CLINICAL DATA: Patient was found unresponsive. Apparently hit the
floor from a standing position. Intubated.

EXAM:
CT HEAD WITHOUT CONTRAST
TECHNIQUE: Contiguous axial images were obtained from the base of the skull
through the vertex without intravenous contrast.

[Series 2: headseq 4.8 h37s · axial · 0.43mm/px · z∈[+256,+419]mm · 16 of 36 slices shown, 20 images]
[im 2/36  brain]
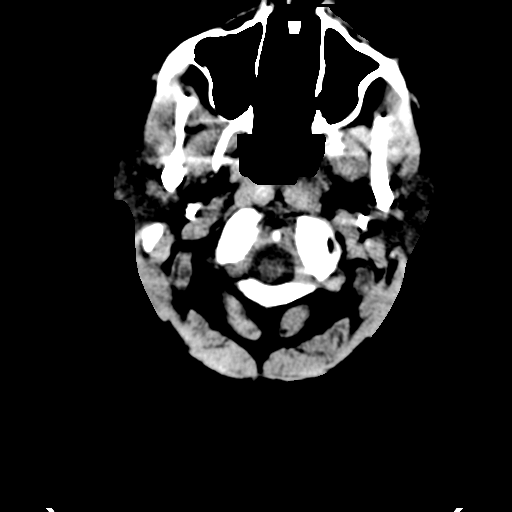
[im 2/36  bone]
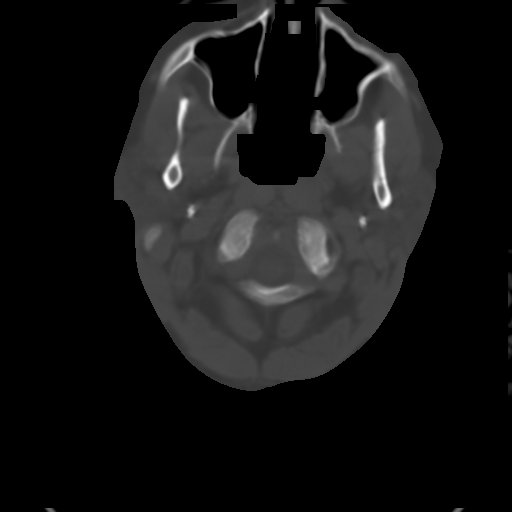
[im 4/36  brain]
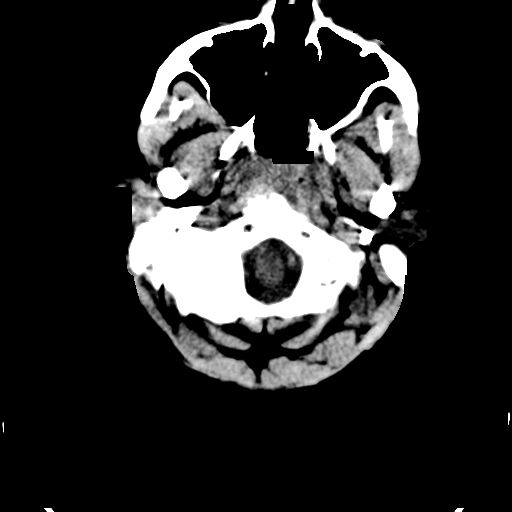
[im 6/36  brain]
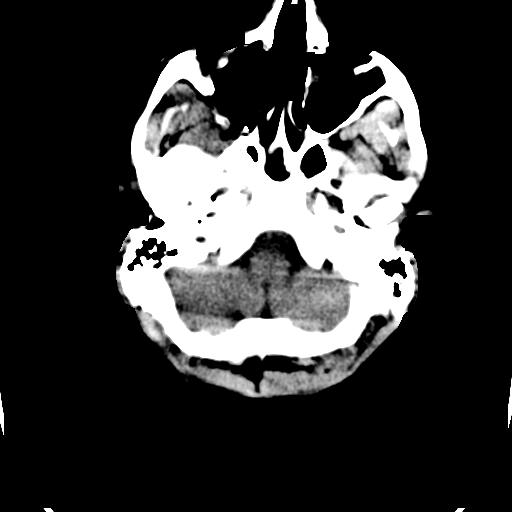
[im 8/36  brain]
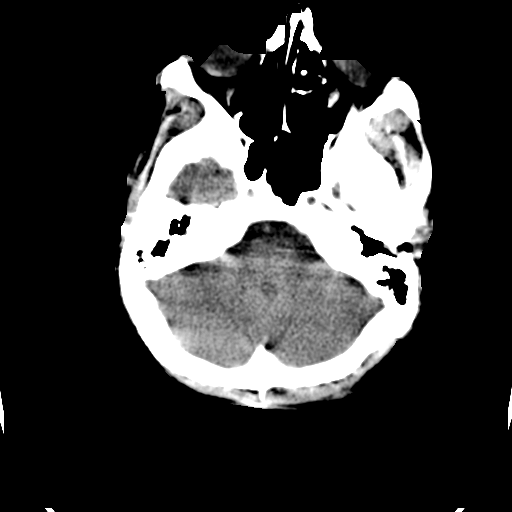
[im 12/36  brain]
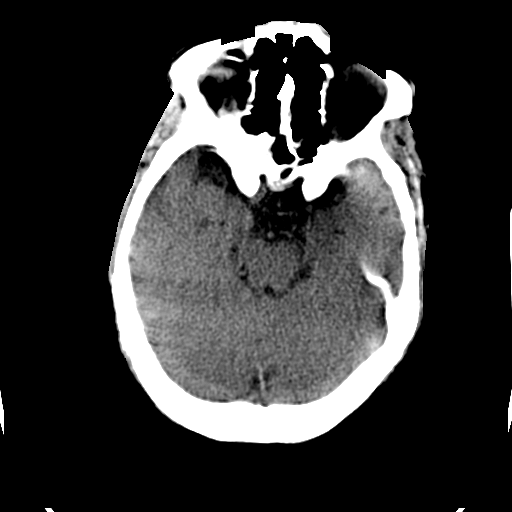
[im 12/36  bone]
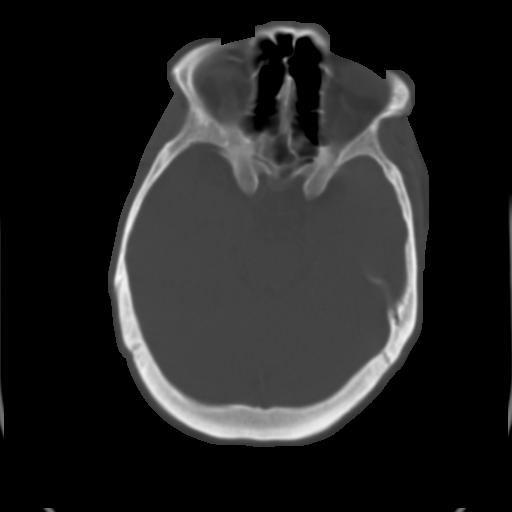
[im 13/36  brain]
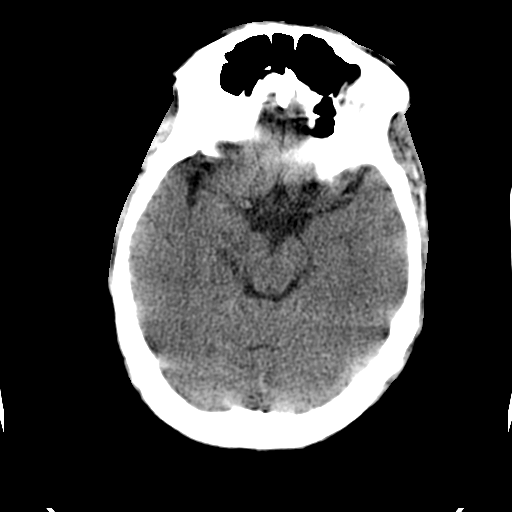
[im 15/36  brain]
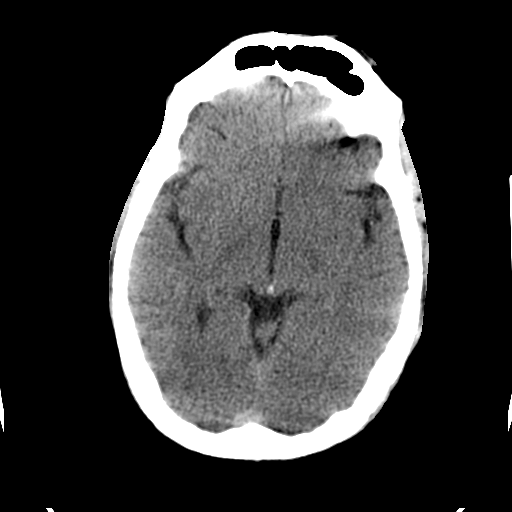
[im 17/36  brain]
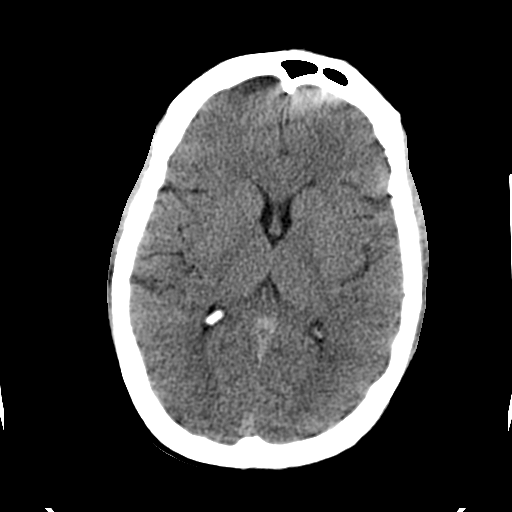
[im 19/36  brain]
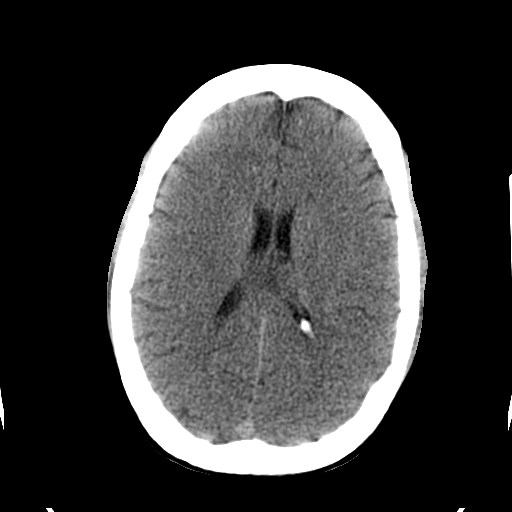
[im 19/36  bone]
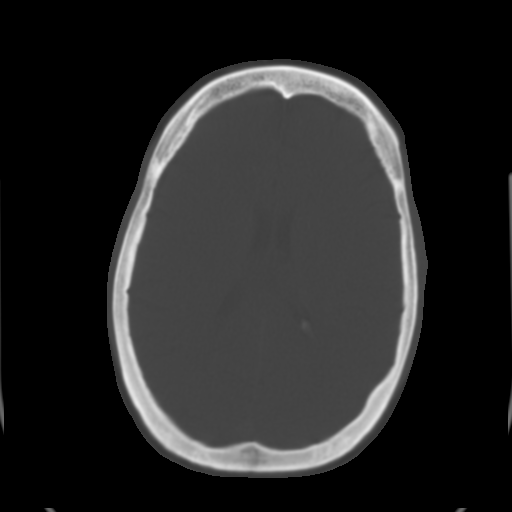
[im 21/36  brain]
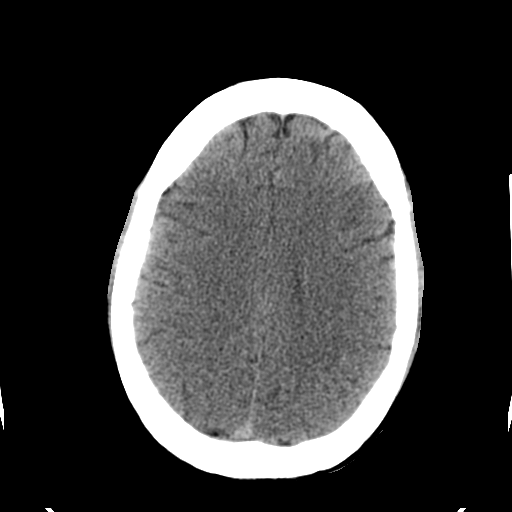
[im 23/36  brain]
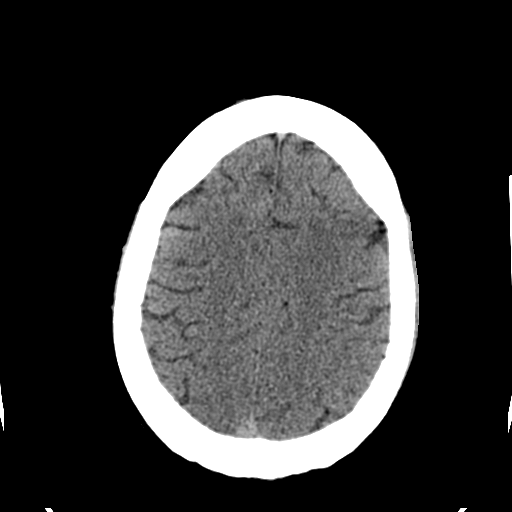
[im 24/36  brain]
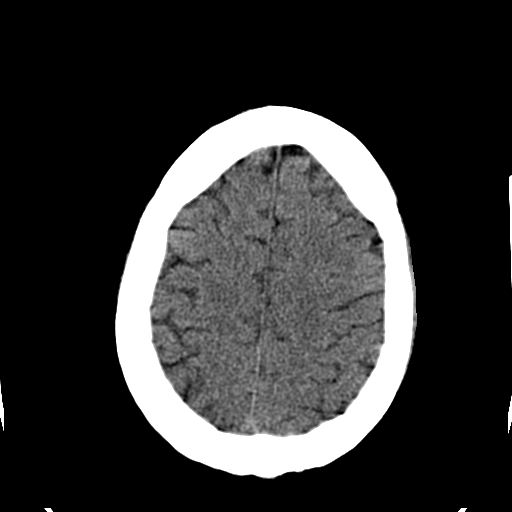
[im 28/36  brain]
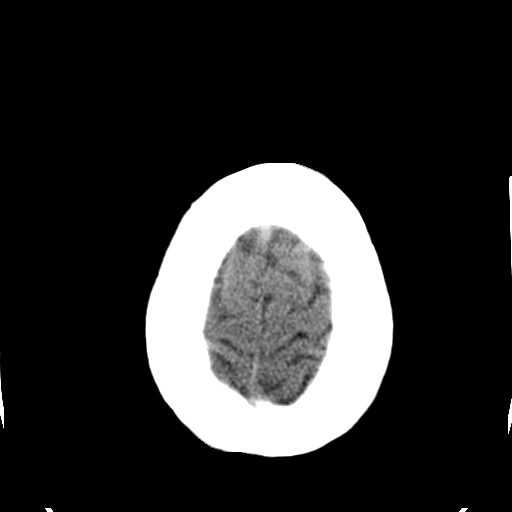
[im 28/36  bone]
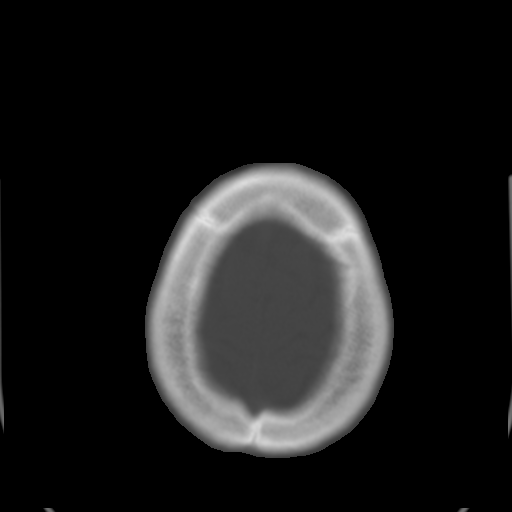
[im 30/36  brain]
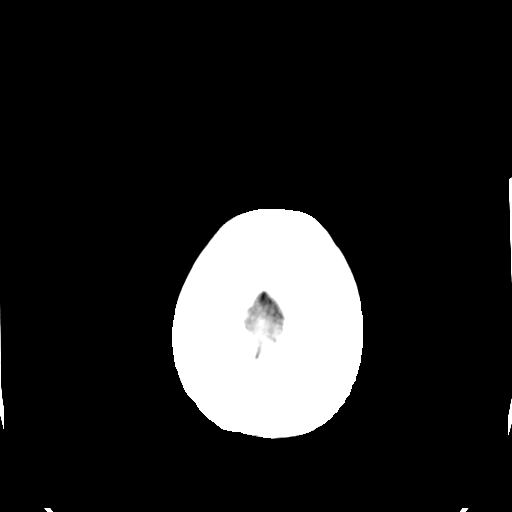
[im 32/36  brain]
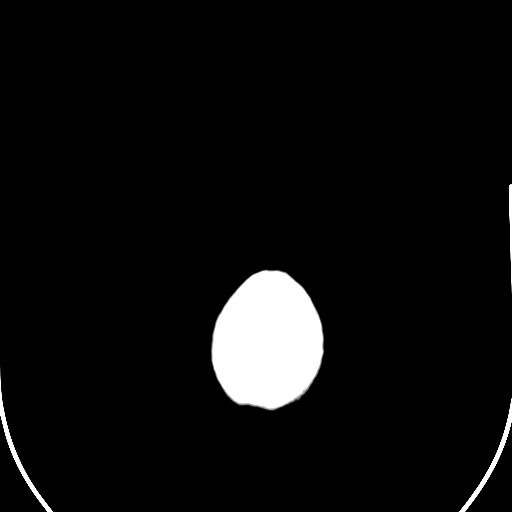
[im 34/36  brain]
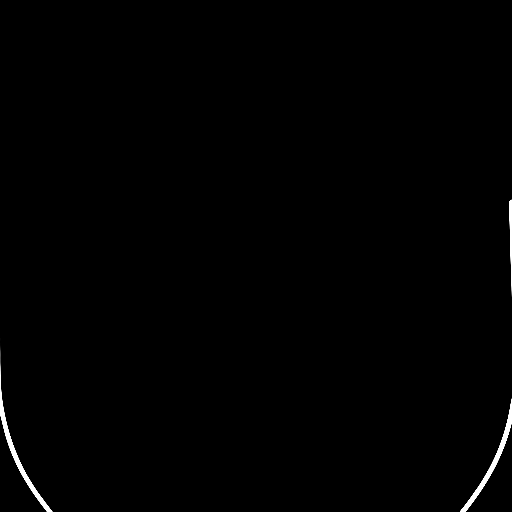

[16 of 30 positions shown; findings below may reference images not displayed]

FINDINGS: Technically limited study due to motion artifact despite repeat
imaging. As visualized, the ventricles and sulci appear symmetrical.
No mass effect or midline shift. No abnormal extra-axial fluid
collections. Gray-white matter junctions are distinct. Basal
cisterns are not effaced. No evidence of acute intracranial
hemorrhage. No depressed skull fractures. Mucosal thickening in the
maxillary antra. Mastoid air cells are not opacified.
IMPRESSION: Motion artifact limits the study. No acute intracranial abnormality
is identified.

## 2015-11-07 LAB — BLOOD GAS, ARTERIAL
Acid-Base Excess: 11.8 mmol/L — ABNORMAL HIGH (ref 0.0–2.0)
Bicarbonate: 14 mEq/L — ABNORMAL LOW (ref 20.0–24.0)
Drawn by: 22223
FIO2: 100
O2 Saturation: 93.2 %
PEEP/CPAP: 5 cmH2O
RATE: 14 resp/min
TCO2: 12.6 mmol/L (ref 0–100)
VT: 600 mL
pCO2 arterial: 33 mmHg — ABNORMAL LOW (ref 35.0–45.0)
pH, Arterial: 7.251 — ABNORMAL LOW (ref 7.350–7.450)
pO2, Arterial: 84.8 mmHg (ref 80.0–100.0)
